# Patient Record
Sex: Male | Born: 1985 | Race: White | Hispanic: No | Marital: Single | Smoking: Current every day smoker
Health system: Southern US, Community
[De-identification: ages and names within clinical notes are randomized; demographics above are authoritative.]

---

## 2014-06-02 ENCOUNTER — Ambulatory Visit
Admission: EM | Admit: 2014-06-02 | Discharge: 2014-06-02 | Disposition: A | Discharge status: Home / Self Care | Payer: 59 | Attending: Family Medicine | Admitting: Family Medicine

## 2014-06-02 ENCOUNTER — Other Ambulatory Visit: Payer: Self-pay

## 2014-06-02 ENCOUNTER — Encounter: Payer: Self-pay | Admitting: Emergency Medicine

## 2014-06-02 DIAGNOSIS — R0789 Other chest pain: Secondary | ICD-10-CM

## 2014-06-02 DIAGNOSIS — R079 Chest pain, unspecified: Secondary | ICD-10-CM | POA: Diagnosis not present

## 2014-06-02 DIAGNOSIS — F17210 Nicotine dependence, cigarettes, uncomplicated: Secondary | ICD-10-CM | POA: Diagnosis not present

## 2014-06-02 DIAGNOSIS — M940 Chondrocostal junction syndrome [Tietze]: Secondary | ICD-10-CM | POA: Insufficient documentation

## 2014-06-02 NOTE — ED Provider Notes (Signed)
CSN: [id number]     Arrival date & time 06/02/14  1234 History   First MD Initiated Contact with Patient 06/02/14 1411     Chief Complaint  Patient presents with  . Chest Pain   (Consider location/radiation/quality/duration/timing/severity/associated sxs/prior Treatment) HPI Comments: 29 yo male with a h/o chest pain this morning around 10am. States felt "like needles", sharp on the left upper chest area. Patient was drinking water, taking a break from splitting wood. States had started splitting wood around 7:30am and had spent about 2 hours splitting wood, prior to symptoms. Denies pain radiating to jaw, neck, or arm. Denies diaphoresis, nausea, vomiting, shortness of breath, fevers, chills. States recently had a "cold".   The history is provided by the patient.    History reviewed. No pertinent past medical history. History reviewed. No pertinent past surgical history. Family History  Problem Relation Age of Onset  . Hypertension Father   . Diabetes Father    History  Substance Use Topics  . Smoking status: Current Every Day Smoker -- 0.50 packs/day    Types: Cigarettes  . Smokeless tobacco: Never Used  . Alcohol Use: Yes    Review of Systems  Allergies  Review of patient's allergies indicates no known allergies.  Home Medications   Prior to Admission medications   Not on File   BP 121/65 mmHg  Pulse 96  Temp(Src) 98.6 F (37 C) (Tympanic)  Resp 16  Ht 5\' 9"  (1.753 m)  Wt 150 lb (68.04 kg)  BMI 22.14 kg/m2  SpO2 99% Physical Exam  Constitutional: He appears well-developed and well-nourished. No distress.  HENT:  Head: Normocephalic and atraumatic.  Right Ear: Tympanic membrane, external ear and ear canal normal.  Left Ear: Tympanic membrane, external ear and ear canal normal.  Nose: Nose normal.  Mouth/Throat: Uvula is midline, oropharynx is clear and moist and mucous membranes are normal. No oropharyngeal exudate or tonsillar abscesses.  Eyes: Conjunctivae  and EOM are normal. Pupils are equal, round, and reactive to light. Right eye exhibits no discharge. Left eye exhibits no discharge. No scleral icterus.  Neck: Normal range of motion. Neck supple. No tracheal deviation present. No thyromegaly present.  Cardiovascular: Normal rate, regular rhythm and normal heart sounds.   Pulmonary/Chest: Effort normal and breath sounds normal. No stridor. No respiratory distress. He has no wheezes. He has no rales. He exhibits no tenderness.  Lymphadenopathy:    He has no cervical adenopathy.  Neurological: He is alert.  Skin: Skin is warm and dry. No rash noted. He is not diaphoretic.  Nursing note and vitals reviewed.   ED Course  ED EKG  Date/Time: 06/02/2014 2:20 PM Performed by: Payton MccallumONTY, Greggory Safranek Authorized by: Payton MccallumONTY, Tae Vonada Comparison: not compared with previous ECG  Previous ECG: no previous ECG available Rhythm: sinus rhythm Rate: normal QRS axis: normal Conduction: conduction normal ST Segments: ST segments normal T Waves: T waves normal Other: no other findings Clinical impression: normal ECG Comments: Personally reviewed EKG and agree with computerized readout.    (including critical care time) Labs Review Labs Reviewed - No data to display  Imaging Review No results found.   MDM   1. Costochondritis, acute    Plan: 1. EKG results and diagnosis reviewed with patient 2. Recommend supportive treatment with otc analgesics/NSAIDS 3. F/u prn if symptoms worsen or don't improve    Payton Mccallumrlando Ashely Goosby, MD 06/02/14 1422

## 2014-06-02 NOTE — ED Notes (Signed)
Patient states that he had chest pain after splitting wood this morning around 9:30am.  Patient states that he is not having any chest pain at this time.  Patient denies difficulty breathing or SOB.

## 2015-06-05 ENCOUNTER — Ambulatory Visit
Admission: EM | Admit: 2015-06-05 | Discharge: 2015-06-05 | Disposition: A | Discharge status: Home / Self Care | Payer: 59 | Attending: Family Medicine | Admitting: Family Medicine

## 2015-06-05 ENCOUNTER — Ambulatory Visit (INDEPENDENT_AMBULATORY_CARE_PROVIDER_SITE_OTHER): Payer: 59

## 2015-06-05 ENCOUNTER — Encounter: Payer: Self-pay | Admitting: Emergency Medicine

## 2015-06-05 DIAGNOSIS — S60221A Contusion of right hand, initial encounter: Secondary | ICD-10-CM

## 2015-06-05 NOTE — ED Provider Notes (Signed)
[facility]  ____________________________________________  Time seen: Approximately 2:05 PM  I have reviewed the triage vital signs and the nursing notes.   HISTORY  Chief Complaint Hand Pain   HPI Craig Lane is a 30 y.o. male  Presents with a complaint of right hand pain. Night. Patient reports that last night at approximately midnight he was going inside the house and states that the door was closing and he reached his hand quickly into the door crack and accidentally the door shut on his hand. Patient reports right hand pain since the incident. Denies any other injury or pain. Denies head injury or loss of consciousness. Denies fall to the ground. Denies any history of right hand pain. Reports he is right-hand dominant. 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   Patient states right hand pain is to the outside of his right hand and described as moderate. Denies any numbness or tingling sensation. Denies decreased range of motion but does state that it hurts to move his right hand. Patient again denies any other complaints.    History reviewed. No pertinent past medical history.  denies  There are no active problems to display for this patient. Denies   History reviewed. No pertinent past surgical history.  denies  No current outpatient prescriptions on file.  denies  Allergies Review of patient's allergies indicates no known allergies.  Family History  Problem Relation Age of Onset  . Hypertension Father   . Diabetes Father     Social History Social History  Substance Use Topics  . Smoking status: Current Every Day Smoker -- 0.50 packs/day    Types: Cigarettes  . Smokeless tobacco: Never Used  . Alcohol Use: Yes    Review of Systems Constitutional: No fever/chills Eyes: No visual changes. ENT: No sore throat. Cardiovascular: Denies chest pain. Respiratory: Denies shortness of breath. Gastrointestinal: No abdominal pain.   No nausea, no vomiting.  No diarrhea.  No constipation. Genitourinary: Negative for dysuria. Musculoskeletal: Negative for back pain. positive right hand pain.  Skin: Negative for rash. Neurological: Negative for headaches, focal weakness or numbness.  10-point ROS otherwise negative.  ____________________________________________   PHYSICAL EXAM:  VITAL SIGNS: ED Triage Vitals  Enc Vitals Group     BP 06/05/15 1313 112/72 mmHg     Pulse Rate 06/05/15 1313 95     Resp 06/05/15 1313 16     Temp 06/05/15 1313 98.7 F (37.1 C)     Temp Source 06/05/15 1313 Tympanic     SpO2 06/05/15 1313 98 %     Weight 06/05/15 1313 145 lb (65.772 kg)     Height 06/05/15 1313  (1.727 m)     Head Cir --      Peak Flow --      Pain Score 06/05/15 1314 9     Pain Loc --      Pain Edu? --      Excl. in GC? --     Constitutional: Alert and oriented. Well appearing and in no acute distress. Eyes: Conjunctivae are normal. PERRL. EOMI. Head: Atraumatic. Neck: No stridor.  No cervical spine tenderness to palpation. Cardiovascular: Normal rate, regular rhythm. Grossly normal heart sounds.  Good peripheral circulation. Respiratory: Normal respiratory effort.  No retractions. Lungs CTAB. Gastrointestinal: Soft and nontender. Musculoskeletal: No lower or upper extremity tenderness nor edema.  Except : Right dorsal hand along the mid to distal fourth and fifth metacarpal moderate pain with  mild swelling, mild pain with fourth and fifth resisted finger flexion and extension but full range of motion present, no motor or tendon deficit to right hand, distal capillary refill less than 2 seconds to all right hand distal fingers, distal radial pulses equal bilaterally, right hand grip slightly weaker than left, right hand and right upper extremity otherwise nontender.  Neurologic:  Normal speech and language. No gross focal neurologic deficits are appreciated. No gait instability. Skin:  Skin is warm, dry  and intact. No rash noted. Psychiatric: Mood and affect are normal. Speech and behavior are normal.  ____________________________________________   LABS (all labs ordered are listed, but only abnormal results are displayed)  Labs Reviewed - No data to display ____________________________________________  RADIOLOGY  Dg Hand Complete Right  06/05/2015  CLINICAL DATA:  30 year old male with injury to the right hand after hitting it in a door yesterday evening. Pain in the medial aspect of the right hand. EXAM: RIGHT HAND - COMPLETE 3+ VIEW COMPARISON:  No priors. FINDINGS: Multiple views of the right hand demonstrate no acute displaced fracture, subluxation, dislocation, or soft tissue abnormality. IMPRESSION: No acute radiographic abnormality of the right hand. Electronically Signed   By: Trudie Reedaniel  Entrikin M.D.   On: 06/05/2015 13:39   ____________________________________________   PROCEDURES  Procedure(s) performed:  Velcro cock up splint applied to right hand by RN. Neurovascular intact post application.  __________________   INITIAL IMPRESSION / ASSESSMENT AND PLAN / ED COURSE  Pertinent labs & imaging results that were available during my care of the patient were reviewed by me and considered in my medical decision making (see chart for details).  Very well-appearing patient. No acute distress. Presents for the complaint of right hand pain post mechanical injury last night at home. Right hand dorsal fourth and fifth metacarpal pain with mild swelling. Full range of motion but with pain to right hand. Will evaluate by x-ray.  Per radiologist right hand x-ray no acute radiographic abnormality of the right hand. Suspect contusion injuries. Splint applied. Encouraged rest, ice, elevation, over-the-counter Tylenol or ibuprofen as needed. Patient denies need for prescription medications. 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  Discussed follow up with Primary care physician this week. Discussed follow up and return parameters including no resolution or any worsening concerns. Patient verbalized understanding and agreed to plan.   ____________________________________________   FINAL CLINICAL IMPRESSION(S) / ED DIAGNOSES  Final diagnoses:  Hand contusion, right, initial encounter     New Prescriptions   No medications on file    Note: This dictation was prepared with Dragon dictation along with smaller phrase technology. Any transcriptional errors that result from this process are unintentional.       Renford DillsLindsey Hyla Coard, NP 06/05/15 1458

## 2015-06-05 NOTE — Discharge Instructions (Signed)
Take over the counter tylenol or ibuprofen as needed for pain. Apply ice and elevate. Wear splint as long as pain continues for support.   Follow up with your primary care physician or orthopedic this week as needed. Return to Urgent care for new or worsening concerns.    Hand Contusion A hand contusion is a deep bruise on your hand area. Contusions are the result of an injury that caused bleeding under the skin. The contusion may turn blue, purple, or yellow. Minor injuries will give you a painless contusion, but more severe contusions may stay painful and swollen for a few weeks. CAUSES  A contusion is usually caused by a blow, trauma, or direct force to an area of the body. SYMPTOMS   Swelling and redness of the injured area.  Discoloration of the injured area.  Tenderness and soreness of the injured area.  Pain. DIAGNOSIS  The diagnosis can be made by taking a history and performing a physical exam. An X-ray, CT scan, or MRI may be needed to determine if there were any associated injuries, such as broken bones (fractures). TREATMENT  Often, the best treatment for a hand contusion is resting, elevating, icing, and applying cold compresses to the injured area. Over-the-counter medicines may also be recommended for pain control. HOME CARE INSTRUCTIONS   Put ice on the injured area.  Put ice in a plastic bag.  Place a towel between your skin and the bag.  Leave the ice on for 15-20 minutes, 03-04 times a day.  Only take over-the-counter or prescription medicines as directed by your caregiver. Your caregiver may recommend avoiding anti-inflammatory medicines (aspirin, ibuprofen, and naproxen) for 48 hours because these medicines may increase bruising.  If told, use an elastic wrap as directed. This can help reduce swelling. You may remove the wrap for sleeping, showering, and bathing. If your fingers become numb, cold, or blue, take the wrap off and reapply it more  loosely.  Elevate your hand with pillows to reduce swelling.  Avoid overusing your hand if it is painful. SEEK IMMEDIATE MEDICAL CARE IF:   You have increased redness, swelling, or pain in your hand.  Your swelling or pain is not relieved with medicines.  You have loss of feeling in your hand or are unable to move your fingers.  Your hand turns cold or blue.  You have pain when you move your fingers.  Your hand becomes warm to the touch.  Your contusion does not improve in 2 days. MAKE SURE YOU:   Understand these instructions.  Will watch your condition.  Will get help right away if you are not doing well or get worse.   This information is not intended to replace advice given to you by your health care provider. Make sure you discuss any questions you have with your health care provider.   Document Released: 06/17/2001 Document Revised: 09/20/2011 Document Reviewed: 06/19/2011 Elsevier Interactive Patient Education [employer]2016 [employer] Inc[employer]

## 2015-06-05 NOTE — ED Notes (Signed)
Patient slammed a door on his right hand late last night.  Patient c/o pain in his right hand.

## 2015-07-29 ENCOUNTER — Ambulatory Visit
Admission: EM | Admit: 2015-07-29 | Discharge: 2015-07-29 | Disposition: A | Discharge status: Hospice | Payer: 59 | Attending: Family Medicine | Admitting: Family Medicine

## 2015-07-29 ENCOUNTER — Encounter: Payer: Self-pay | Admitting: *Deleted

## 2015-07-29 DIAGNOSIS — R791 Abnormal coagulation profile: Secondary | ICD-10-CM | POA: Diagnosis not present

## 2015-07-29 DIAGNOSIS — R079 Chest pain, unspecified: Secondary | ICD-10-CM | POA: Diagnosis present

## 2015-07-29 DIAGNOSIS — R071 Chest pain on breathing: Secondary | ICD-10-CM | POA: Insufficient documentation

## 2015-07-29 DIAGNOSIS — R0602 Shortness of breath: Secondary | ICD-10-CM | POA: Insufficient documentation

## 2015-07-29 DIAGNOSIS — F17210 Nicotine dependence, cigarettes, uncomplicated: Secondary | ICD-10-CM | POA: Diagnosis not present

## 2015-07-29 DIAGNOSIS — R7989 Other specified abnormal findings of blood chemistry: Secondary | ICD-10-CM

## 2015-07-29 LAB — CBC WITH DIFFERENTIAL/PLATELET
BASOS PCT: 0 %
Basophils Absolute: 0 10*3/uL (ref 0–0.1)
Eosinophils Absolute: 0.2 10*3/uL (ref 0–0.7)
Eosinophils Relative: 2 %
HEMATOCRIT: 42 % (ref 40.0–52.0)
HEMOGLOBIN: 14.2 g/dL (ref 13.0–18.0)
LYMPHS PCT: 16 %
Lymphs Abs: 1.7 10*3/uL (ref 1.0–3.6)
MCH: 31.8 pg (ref 26.0–34.0)
MCHC: 33.9 g/dL (ref 32.0–36.0)
MCV: 93.8 fL (ref 80.0–100.0)
Monocytes Absolute: 1 10*3/uL (ref 0.2–1.0)
Monocytes Relative: 9 %
NEUTROS ABS: 7.8 10*3/uL — AB (ref 1.4–6.5)
NEUTROS PCT: 73 %
Platelets: 215 10*3/uL (ref 150–440)
RBC: 4.47 MIL/uL (ref 4.40–5.90)
RDW: 13.2 % (ref 11.5–14.5)
WBC: 10.7 10*3/uL — AB (ref 3.8–10.6)

## 2015-07-29 LAB — CK: Total CK: 6507 U/L — ABNORMAL HIGH (ref 49–397)

## 2015-07-29 LAB — CKMB (ARMC ONLY): CK, MB: 2.8 ng/mL (ref 0.5–5.0)

## 2015-07-29 LAB — COMPREHENSIVE METABOLIC PANEL
ALBUMIN: 4 g/dL (ref 3.5–5.0)
ALT: 60 U/L (ref 17–63)
ANION GAP: 8 (ref 5–15)
AST: 158 U/L — AB (ref 15–41)
Alkaline Phosphatase: 56 U/L (ref 38–126)
BUN: 8 mg/dL (ref 6–20)
CO2: 29 mmol/L (ref 22–32)
Calcium: 9.1 mg/dL (ref 8.9–10.3)
Chloride: 100 mmol/L — ABNORMAL LOW (ref 101–111)
Creatinine, Ser: 0.78 mg/dL (ref 0.61–1.24)
GFR calc non Af Amer: >60 mL/min (ref >60)
Glucose, Bld: 85 mg/dL (ref 65–99)
POTASSIUM: 3.7 mmol/L (ref 3.5–5.1)
Sodium: 137 mmol/L (ref 135–145)
TOTAL PROTEIN: 7.5 g/dL (ref 6.5–8.1)
Total Bilirubin: 0.5 mg/dL (ref 0.3–1.2)

## 2015-07-29 LAB — TROPONIN I

## 2015-07-29 LAB — FIBRIN DERIVATIVES D-DIMER (ARMC ONLY): FIBRIN DERIVATIVES D-DIMER (ARMC): 530.49 — AB (ref 0–499)

## 2015-07-29 MED ORDER — KETOROLAC TROMETHAMINE 60 MG/2ML IM SOLN
60.0000 mg | Freq: Once | INTRAMUSCULAR | Status: AC
  Administered 2015-07-29: 60 mg via INTRAMUSCULAR

## 2015-07-29 MED ORDER — KETOROLAC TROMETHAMINE 60 MG/2ML IM SOLN: 60.0000 mg | Freq: Once | INTRAMUSCULAR | Status: DC

## 2015-07-29 NOTE — ED Notes (Signed)
Left posterior chest pain radiating to left shoulder, sudden onset, described as sharp, no associated symptoms.

## 2015-07-29 NOTE — ED Provider Notes (Signed)
CSN: [id number]     Arrival date & time 07/29/15  1642 History   First MD Initiated Contact with Patient 07/29/15 1650    Nurses notes were reviewed. Chief Complaint  Patient presents with  . Chest Pain   Patient presents with shortness of breath and chest pain. Reports chest pain is deep in his chest seems goes to his upper back. He states he was out smoking when the pain suddenly hit him. Carleene Cooper and came to the urgent care. He had chest pain about 2 years ago when he was under a lot of stress and was diagnosed anxiety and costochondritis. That did resolve but he never did get any type of significant evaluation such as a stress test or echocardiogram.   He denies any significant  medical history or past surgical history his father has hypertension diabetes and he does smoke. No known drug allergies     (Consider location/radiation/quality/duration/timing/severity/associated sxs/prior Treatment) Patient is a 30 y.o. male presenting with chest pain. The history is provided by the patient. No language interpreter was used.  Chest Pain Pain location:  Substernal area Pain radiates to:  Upper back Pain radiates to the back: yes   Pain severity:  Severe Onset quality:  Sudden Duration:  1 hour Timing:  Constant Progression:  Unchanged Chronicity:  New Context: breathing   Context: no drug use, not eating, no intercourse, not lifting, no movement, not raising an arm, not at rest, no stress and no trauma   Relieved by:  Nothing Worsened by:  Nothing tried Ineffective treatments:  None tried Associated symptoms: shortness of breath   Associated symptoms: no abdominal pain, no dizziness, no dysphagia, no fatigue, no fever, no lower extremity edema, no orthopnea, no palpitations and no PND   Risk factors: male sex   Risk factors: no aortic disease, no birth control, no coronary artery disease, no diabetes mellitus, no high cholesterol, no hypertension, no Marfan's syndrome, not obese, not  pregnant, no prior DVT/PE, no smoking and no surgery     History reviewed. No pertinent past medical history. History reviewed. No pertinent past surgical history. Family History  Problem Relation Age of Onset  . Hypertension Father   . Diabetes Father    Social History  Substance Use Topics  . Smoking status: Current Every Day Smoker -- 0.50 packs/day    Types: Cigarettes  . Smokeless tobacco: Never Used  . Alcohol Use: Yes    Review of Systems  Constitutional: Negative for fever and fatigue.  HENT: Negative for trouble swallowing.   Respiratory: Positive for shortness of breath.   Cardiovascular: Positive for chest pain. Negative for palpitations, orthopnea and PND.  Gastrointestinal: Negative for abdominal pain.  Neurological: Negative for dizziness.  All other systems reviewed and are negative.   Allergies  Review of patient's allergies indicates no known allergies.  Home Medications   Prior to Admission medications   Not on File   Meds Ordered and Administered this Visit   Medications  ketorolac (TORADOL) injection 60 mg (60 mg Intramuscular Given 07/29/15 1707)    BP 115/61 mmHg  Pulse 88  Temp(Src) 98.3 F (36.8 C) (Oral)  Resp 12  Ht 5\' 8"  (1.727 m)  Wt 140 lb (63.504 kg)  BMI 21.29 kg/m2  SpO2 99% No data found.   Physical Exam  Constitutional: He is oriented to person, place, and time. He appears well-developed and well-nourished.  HENT:  Head: Normocephalic and atraumatic.  Eyes: Conjunctivae are normal. Pupils are equal, round,  and reactive to light.  Neck: Normal range of motion. Neck supple.  Cardiovascular: Normal rate, regular rhythm and normal heart sounds.   No murmur heard. Pulmonary/Chest: Effort normal and breath sounds normal. No respiratory distress.  Abdominal: Soft. Bowel sounds are normal.  Musculoskeletal: Normal range of motion.  Neurological: He is alert and oriented to person, place, and time.  Skin: Skin is warm and dry.   Psychiatric: He has a normal mood and affect.  Vitals reviewed.   ED Course  Procedures (including critical care time)  Labs Review Labs Reviewed  COMPREHENSIVE METABOLIC PANEL - Abnormal; Notable for the following:    Chloride 100 (*)    AST 158 (*)    All other components within normal limits  CBC WITH DIFFERENTIAL/PLATELET - Abnormal; Notable for the following:    WBC 10.7 (*)    Neutro Abs 7.8 (*)    All other components within normal limits  FIBRIN DERIVATIVES D-DIMER ([facility] ONLY) - Abnormal; Notable for the following:    Fibrin derivatives D-dimer [facility]) 530.49 (*)    All other components within normal limits  TROPONIN I  CKMB([facility] ONLY)  CK    Imaging Review No results found.   Visual Acuity Review  Right Eye Distance:   Left Eye Distance:   Bilateral Distance:    Right Eye Near:   Left Eye Near:    Bilateral Near:      Results for orders placed or performed during the hospital encounter of 07/29/15  Troponin I  Result Value Ref Range   Troponin I <0.03 <0.03 ng/mL  Comprehensive metabolic panel  Result Value Ref Range   Sodium 137 135 - 145 mmol/L   Potassium 3.7 3.5 - 5.1 mmol/L   Chloride 100 (L) 101 - 111 mmol/L   CO2 29 22 - 32 mmol/L   Glucose, Bld 85 65 - 99 mg/dL   BUN 8 6 - 20 mg/dL   Creatinine, Ser 1.61 0.61 - 1.24 mg/dL   Calcium 9.1 8.9 - 09.6 mg/dL   Total Protein 7.5 6.5 - 8.1 g/dL   Albumin 4.0 3.5 - 5.0 g/dL   AST 045 (H) 15 - 41 U/L   ALT 60 17 - 63 U/L   Alkaline Phosphatase 56 38 - 126 U/L   Total Bilirubin 0.5 0.3 - 1.2 mg/dL   GFR calc non Af Amer >60 >60 mL/min   GFR calc Af Amer >60 >60 mL/min   Anion gap 8 5 - 15  CBC with Differential  Result Value Ref Range   WBC 10.7 (H) 3.8 - 10.6 K/uL   RBC 4.47 4.40 - 5.90 MIL/uL   Hemoglobin 14.2 13.0 - 18.0 g/dL   HCT 40.9 81.1 - 91.4 %   MCV 93.8 80.0 - 100.0 fL   MCH 31.8 26.0 - 34.0 pg   MCHC 33.9 32.0 - 36.0 g/dL   RDW 78.2 95.6 - 21.3 %   Platelets 215 150 - 440  K/uL   Neutrophils Relative % 73 %   Neutro Abs 7.8 (H) 1.4 - 6.5 K/uL   Lymphocytes Relative 16 %   Lymphs Abs 1.7 1.0 - 3.6 K/uL   Monocytes Relative 9 %   Monocytes Absolute 1.0 0.2 - 1.0 K/uL   Eosinophils Relative 2 %   Eosinophils Absolute 0.2 0 - 0.7 K/uL   Basophils Relative 0 %   Basophils Absolute 0.0 0 - 0.1 K/uL  Fibrin derivatives D-Dimer ([facility] only)  Result Value Ref Range  Fibrin derivatives D-dimer ([facility]) 530.49 (H) 0 - 499  CKMB([facility] only)  Result Value Ref Range   CK, MB 2.8 0.5 - 5.0 ng/mL     MDM   1. Chest pain on breathing   2. D-dimer, elevated    Patient is feeling much better since given the dose of Toradol IM. Unfortunately despite the Toradol IM his d-dimer was elevated at 530.49. I've explained to him and a friend who is here now with elevated d-dimer chest pain and shortness of breath I think that he would be a candidate to have further studies done. At this point time I'm recommending ED visit and hopefully for a CT chest angiogram for PE study. Initially he wants to wait until tomorrow explained to him that blood clots can be fatal he was having significant amount of pain and shortness of breath when he came in no recommending that he go ahead and get a study done tonight     ED ECG REPORT I, Khaylee Mcevoy H, the attending physician, personally viewed and interpreted this ECG.   Date: 07/29/2015  EKG Time: 16:56:44  Rhythm: normal EKG, normal sinus rhythm, there are no previous tracings available for comparison  Axis: 87  Intervals:none  ST&T Change:none  Hassan RowanEugene Sajad Glander, MD 07/29/15 [phone]

## 2015-07-29 NOTE — Discharge Instructions (Signed)

## 2017-12-06 IMAGING — CR DG HAND COMPLETE 3+V*R*
3 series · 3 of 3 positions shown · non-contrast
Comparison: No priors.

CLINICAL DATA: 30-year-old male with injury to the right hand after
hitting it in a door yesterday evening. Pain in the medial aspect of
the right hand.

EXAM:
RIGHT HAND - COMPLETE 3+ VIEW

[hand ap]
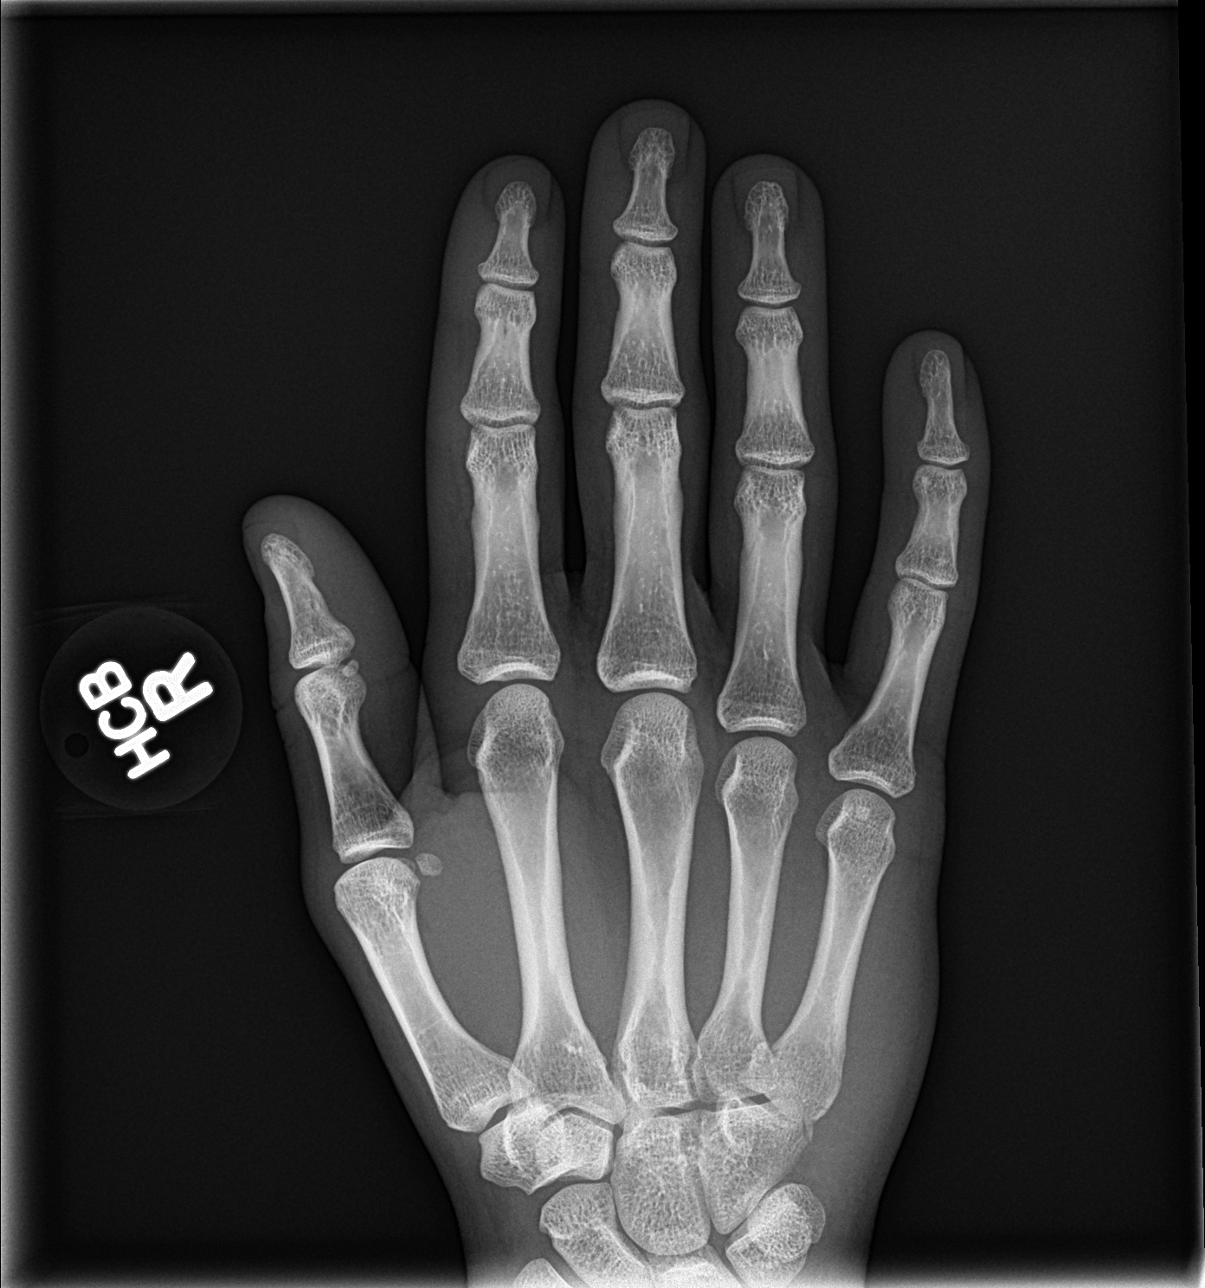

[hand obl]
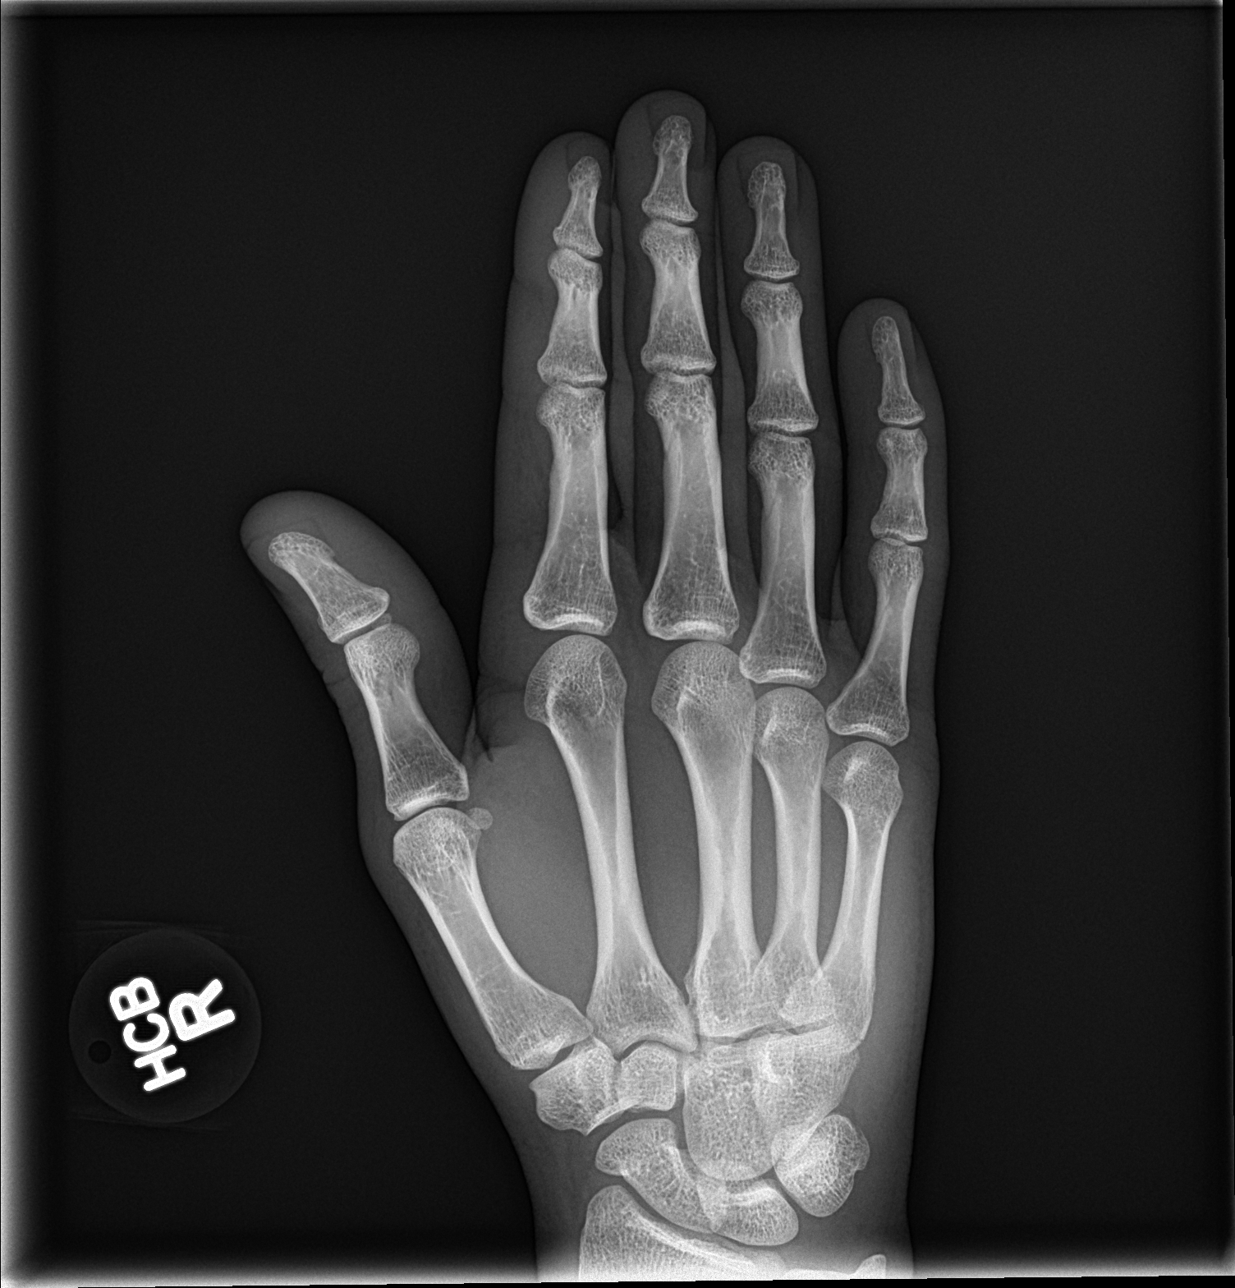

[hand lat]
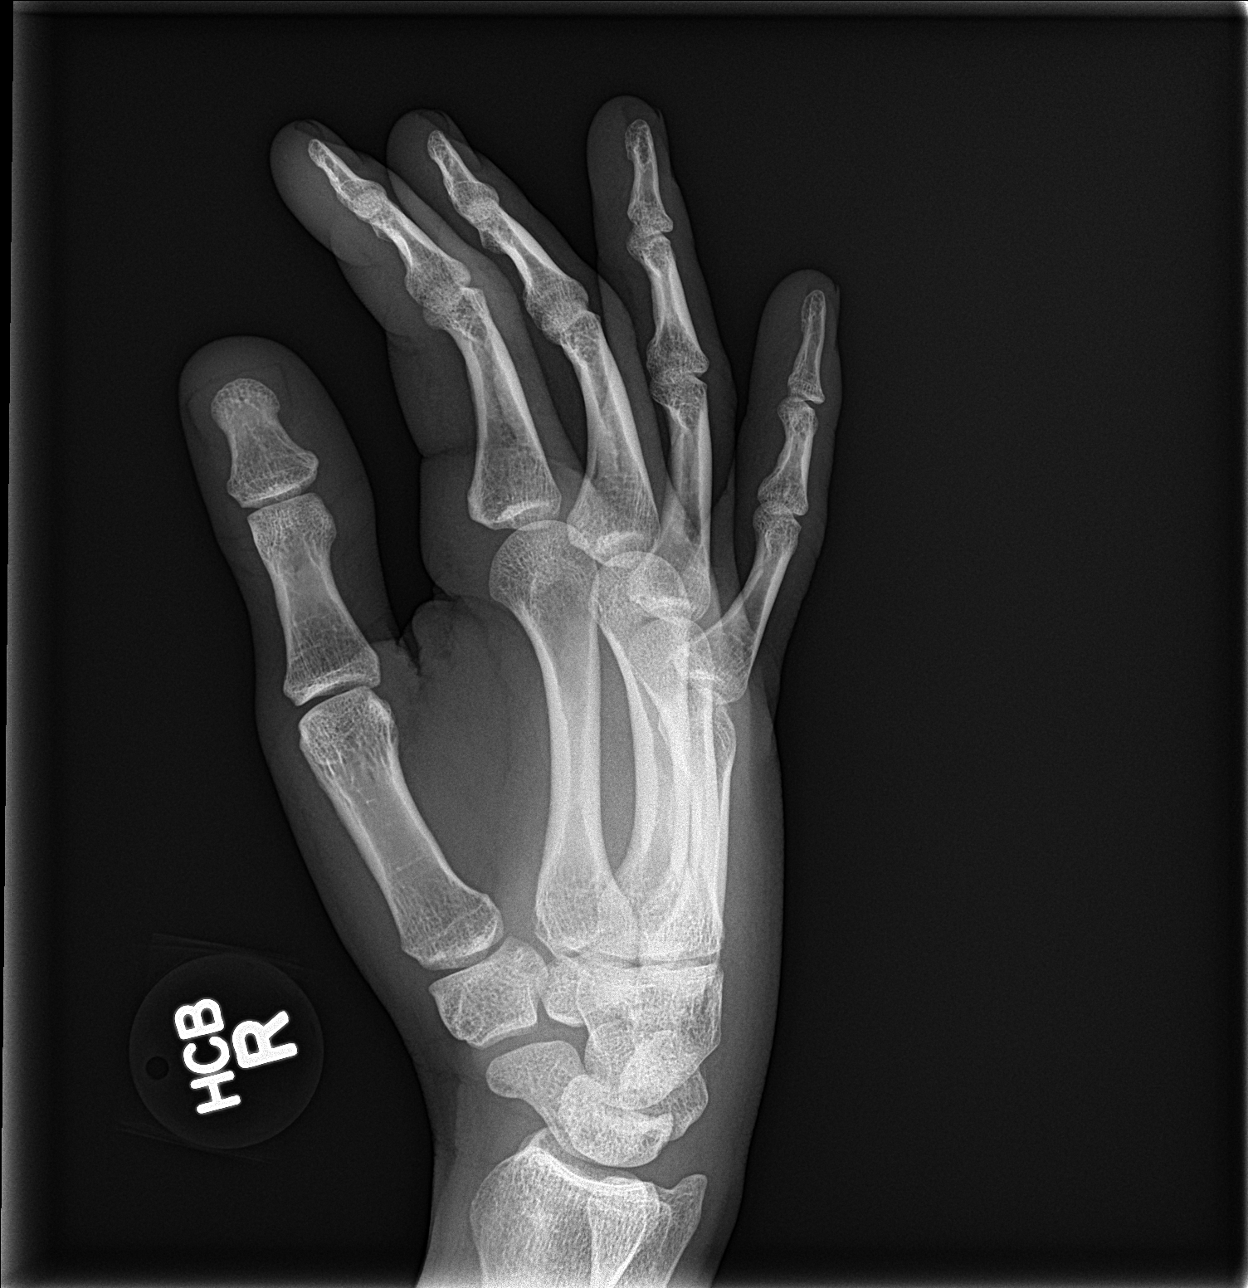

[3 of 3 positions shown; findings below may reference images not displayed]

FINDINGS: Multiple views of the right hand demonstrate no acute displaced
fracture, subluxation, dislocation, or soft tissue abnormality.
IMPRESSION: No acute radiographic abnormality of the right hand.
